# Patient Record
Sex: Female | Born: 1955 | Race: White | Marital: Married | Smoking: Never smoker
Health system: Northeastern US, Academic
[De-identification: ages and names within clinical notes are randomized; demographics above are authoritative.]

---

## 2010-06-15 ENCOUNTER — Ambulatory Visit: Payer: Self-pay

## 2010-06-15 DIAGNOSIS — H11002 Unspecified pterygium of left eye: Secondary | ICD-10-CM | POA: Insufficient documentation

## 2010-06-15 DIAGNOSIS — H04129 Dry eye syndrome of unspecified lacrimal gland: Secondary | ICD-10-CM | POA: Insufficient documentation

## 2010-06-15 DIAGNOSIS — H5210 Myopia, unspecified eye: Secondary | ICD-10-CM

## 2010-06-15 NOTE — Progress Notes (Signed)
 Subjective:   Subjective  Melissa Tanner 06/15/2010   30-Mar-1955 55 y.o. female Referring Dr./Source website  Chief Complaint   Patient presents with   . Blurred Vision     New Patient Eval     HPI     Patient presents for consideration of laser vision correction.  Would   like less dependency on glasses.  Rare SCL wearer.  Seminar attendee.        History reviewed. No pertinent past medical history.   History reviewed. No pertinent past surgical history.   History   Smoking status   . Never Smoker    Smokeless tobacco   . Not on file      History   Alcohol Use: Not on file      History   Drug Use Not on file      Current Outpatient Rx   Name Route Sig Dispense Refill   . YAZ PO Oral Take by mouth daily            Erythromycin  Specialty Problems     None           ROS     Negative for: Constitutional, Gastrointestinal, Neurological, Skin,   Genitourinary, Musculoskeletal, HENT, Endocrine, Cardiovascular, Eyes,   Respiratory, Psychiatric, Heme/Lymph    Other comments for: Allergic/Imm (Seasonal allergies)       Objective:   ObjectiveThere were no vitals filed for this visit.      Base Ophthalmology Exam        Visual Acuity    Right Left Both   Dist sc 20/200 20/400    Dist cc 20/20 20/20    Method: Snellen - Linear    Comments: Dominant OD   Tonometry    Right Left   Pressure 12 12   Method: Applanation   Time: 2:10 PM   Pachymetry    Right Left   Thickness 544 540   Date: 06/15/2010    Comments: ORBSCAN PACH  OD 549  OS 533   Wearing Rx    Sphere Cylinder Axis Add   Right -3.75 -0.75 141 +1.75   Left -4.75 -0.75 163 +1.75   Age: 6m   Type: Progr   Manifest Refraction    Sphere Cylinder Axis Dist   Right -3.50 -0.75 139 20/20   Left -4.50 -0.75 169 20/20   Manifest Refraction #2    Sphere Cylinder Axis Dist   Right -3.14 -0.72 137    Left -4.56 -0.84 169     Comments: Dominant OD    Mr#2 = Zywave PPR   Dilation   Both eyes: 1.0% Tropicamide @ 2:09 PM   Pupils    Pupils Dark Light   Right PERRLA 6.0 4.5   Left PERRLA  6.0 4.5   Visual Fields    Left Right   Result Full Full   Extraocular Movement    Right Left   Result Full Full      Base Ophthalmology Exam Addl. Tests        Keratometry    K1 Axis K2 Axis   Right 45.6 070 44.8 160   Left 45.2 078 44.1 168   Method: Orb Sim K    Comments: White to White   OD 12.0  OS 12.0    Posterior float   OD 0.043    OS 0.064 - revd by RV increased post. Float OS indicative of FFKC.       Additional Notes  Main Ophthalmology Exam        External Exam    Right Left   External Normal Normal   Slit Lamp Exam    Right Left   Lids/Lashes Normal Normal   Conjunctiva/Sclera White and quiet pt, Pinguecula   Cornea haze along nasal side pterygium nasal c slight haze, Haze   Anterior Chamber Deep and quiet Deep and quiet   Iris Round and reactive Round and reactive   Lens Clear Clear   Vitreous Normal Normal   Fundus Exam    Right Left   Disc Normal Normal   Macula Normal Normal   Vessels Normal Normal   Periphery Normal Normal       Neuro/Psych   Oriented x3: Yes   Mood/Affect: Normal                  No annotated images are attached to the encounter.         Assessment:   1. Myopia OU  2. 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 Pterygium OS  4. DES     Plan:   1./2. Pt educated about today's findings and that due to the corneal curvature, refractive surgery is contra-indicated. Also informed pt that likelihood of any progression is extremely small and will likely be stable for the rest of life. Pt should continue regular eye care.   3. Educated pt about today's finding. And should be monitored by regular eye exams and removed if progressive.  4. Pt asked what could be done to help her wear CL's longer. I suggested use restasis and have plugs LL OU. And try contact lenses approved for dry eye such as clarity h2O

## 2016-02-25 ENCOUNTER — Encounter: Payer: Self-pay | Admitting: Gastroenterology

## 2016-05-06 ENCOUNTER — Encounter: Payer: Self-pay | Admitting: Neurosurgery

## 2016-05-06 ENCOUNTER — Ambulatory Visit: Payer: PRIVATE HEALTH INSURANCE | Admitting: Neurosurgery

## 2016-05-06 DIAGNOSIS — M48062 Spinal stenosis, lumbar region with neurogenic claudication: Secondary | ICD-10-CM

## 2016-05-06 DIAGNOSIS — M4310 Spondylolisthesis, site unspecified: Secondary | ICD-10-CM

## 2016-05-06 HISTORY — DX: Spinal stenosis, lumbar region with neurogenic claudication: M48.062

## 2016-05-06 HISTORY — DX: Spondylolisthesis, site unspecified: M43.10

## 2016-05-06 NOTE — Progress Notes (Signed)
Melissa Points, DO  NEUROSURGERY     Brain, Complex spine and Minimally Invasive Surgery.    [facility].    [address] [place], [place] [address]  Ph: [phone]  Fax: [phone]             05/06/16      RE:  Melissa Tanner, 21-Mar-1955    Dear Dr. Mikey Bussing,    I had the pleasure of seeing Melissa Tanner today 05/06/2016  In consultation at my [place], [place] office with regards to low back and leg pain.    Melissa Tanner is a 61 y.o. right-handed female with low back and bilateral leg pain. The pain started in 2015 without any apparent trauma. The pain travels down both legs, right >> left. She also complains of burning pain, numbness/tingling. The pain has significantly worsened since Jan 2018. She states the pain is constant, rates 8/10. Pain is worse with movement, bending, twisting, and improves with rest.     She has done physical therapy did help temporarily.  She also had ESI- Dr Olena Leatherwood , initially the injections were helping but the most recent ( sept  18) did not help.      No bowel or bladder problems.    Past Medical History:   No past medical history on file.  Past Surgical History:   No past surgical history on file.  Allergies:   Allergies   Allergen Reactions    Erythromycin Rash       Medications:  Current Outpatient Prescriptions   Medication    celecoxib (CELEBREX) 200 MG capsule    DULoxetine (CYMBALTA) 60 MG capsule    traZODone (DESYREL) 50 MG tablet    diphenhydrAMINE (BENADRYL) 50 MG capsule     No current facility-administered medications for this visit.      Social History:  Social History     Social History    Marital status: Married     Spouse name: N/A    Number of children: N/A    Years of education: N/A     Social History Main Topics    Smoking status: Never Smoker    Smokeless tobacco: Never Used    Alcohol use Yes    Drug use: No    Sexual activity: Not Asked     Other Topics Concern    None     Social History Narrative          Family History:  No family history on file.    Review of Symptoms:  Twelve systems have been reviewed and pertinent positives noted in HPI.  All other systems negative.    BP 134/82   Pulse 79   Ht 1.626 m ( )   Wt 63.5 kg (140 lb)   BMI 24.03 kg/m2   Pain    05/06/16 1414   PainSc:   8   PainLoc: Back     Physical Exam:  Well-developed. No acute distress.  Eyes:  EOMI, sclera clear  Neck: Supple.  Non-tender. no pain with motion    Cardiovascular:  No cyanosis, good capillary refill.  Pulmonary:  Clear, even, and easy respirations.    Skin:  Warm, dry, and pink.  Musculoskeletal:   Normal tone.  No atrophy.  No fasciculations.       Neurological exam:      Alert and oriented to person, place and time.  Speech is clear and fluent.    Cranial nerves II-XII intact.  Strength:     Upper Extremities Deltoid Biceps Triceps WE WF Int   Right Left Lower Extremities Hip Flexion Knee Flexion Knee Extension DF PF EHL   Right Left Sensory exam normal;    Gait is narrow-based and steady.      DTR 2+ biceps, 2+ triceps, 2+ brachioradialis,     2+ patellar, 2+ Achilles'.      No clonus.  Hoffman's negative.      Toes down.  Romberg negative.  No dysmetria.      + SLR.      Imaging:     I personally reviewed the MRI of lumbar spine done on 09-10-2015 that shows grade 1 L4-5 spondylolisthesis with severe stenosis     Impression/Plan: 61 y.o. female with L4-5 grade 1 spondylolisthesis with severe stenosis    I reviewed the MRI with Melissa Tanner and discussed treatment options.    She has failed conservative treatment- I recommend surgery- L4-5 TLIF.  I discussed surgery and provided surgery information.  We will schedule surgery in the near future if she decides to proceed with surgery.    Should you have any questions regarding my plan, please do not hesitate to contact me personally.    Thank you for this opportunity to care for your  patient.    Sincerely,    Melissa Climes, DO  05/06/16  3:08 PM

## 2016-05-10 ENCOUNTER — Encounter: Payer: Self-pay | Admitting: Neurosurgery

## 2016-07-25 ENCOUNTER — Telehealth: Payer: Self-pay

## 2016-07-25 NOTE — Telephone Encounter (Signed)
Received a fax from [facility]  Surgery was approved reference # [id number]   [employer]Latina Vitale LPN

## 2016-08-22 ENCOUNTER — Other Ambulatory Visit: Payer: Self-pay

## 2016-08-22 DIAGNOSIS — M533 Sacrococcygeal disorders, not elsewhere classified: Secondary | ICD-10-CM

## 2016-08-22 DIAGNOSIS — W19XXXA Unspecified fall, initial encounter: Secondary | ICD-10-CM

## 2016-08-22 DIAGNOSIS — M549 Dorsalgia, unspecified: Secondary | ICD-10-CM

## 2016-08-24 ENCOUNTER — Encounter: Payer: Self-pay | Admitting: Gastroenterology

## 2016-08-26 ENCOUNTER — Encounter: Payer: Self-pay | Admitting: Gastroenterology

## 2016-09-07 ENCOUNTER — Encounter: Payer: Self-pay | Admitting: Gastroenterology

## 2016-09-07 ENCOUNTER — Ambulatory Visit: Payer: PRIVATE HEALTH INSURANCE | Admitting: Neurosurgery

## 2016-09-07 ENCOUNTER — Encounter: Payer: Self-pay | Admitting: Neurosurgery

## 2016-09-07 VITALS — BP 115/74 | HR 77 | Ht 65.0 in | Wt 138.0 lb

## 2016-09-07 DIAGNOSIS — M4310 Spondylolisthesis, site unspecified: Secondary | ICD-10-CM

## 2016-09-07 DIAGNOSIS — M48062 Spinal stenosis, lumbar region with neurogenic claudication: Secondary | ICD-10-CM

## 2016-09-07 MED ORDER — METHOCARBAMOL 500 MG PO TABS *I*
500.0000 mg | ORAL_TABLET | Freq: Three times a day (TID) | ORAL | 1 refills | Status: DC | PRN
Start: 2016-09-07 — End: 2016-12-21

## 2016-09-07 MED ORDER — HYDROCODONE-ACETAMINOPHEN 5-325 MG PO TABS *I*
1.0000 | ORAL_TABLET | Freq: Four times a day (QID) | ORAL | 0 refills | Status: DC | PRN
Start: 2016-09-07 — End: 2016-09-23

## 2016-09-07 NOTE — Progress Notes (Signed)
Dayle PointsSHAHNAWAZ H. Franklin Clapsaddle, DO  NEUROSURGERY     Brain, Complex spine and Minimally Invasive Surgery.    [facility].    [address] [place], [place] [id number]  Ph: [phone]  Fax: [phone]             09/07/16      RE:  Melissa MawKathleen Tanner, [birth year]    Dear Dr. Olena Tanner,    I had the pleasure of seeing Melissa Tanner today 09/07/2016  [place] with regards to low back and leg pain.    Melissa Tanner returns for pre-op today.  She fell few weeks ago and had xrays - no fracture.  She states now the left leg pain is worse than right leg     Prior notes:    Melissa Tanner is a 61 y.o. right-handed female with low back and bilateral leg pain. The pain started in 2015 without any apparent trauma. The pain travels down both legs, right >> left. She also complains of burning pain, numbness/tingling. The pain has significantly worsened since Jan 2018. She states the pain is constant, rates 8/10. Pain is worse with movement, bending, twisting, and improves with rest.     She has done physical therapy did help temporarily.  She also had ESI- Dr Melissa Tanner , initially the injections were helping but the most recent ( sept  18) did not help.      No bowel or bladder problems.    Past Medical History:   Past Medical History:   Diagnosis Date    Spinal stenosis of lumbar region with neurogenic claudication 05/06/2016    Spondylolisthesis, grade 1 05/06/2016     Past Surgical History:   No past surgical history on file.  Allergies:   Allergies   Allergen Reactions    Erythromycin Rash       Medications:  Current Outpatient Prescriptions   Medication    celecoxib (CELEBREX) 200 MG capsule    DULoxetine (CYMBALTA) 60 MG capsule    traZODone (DESYREL) 50 MG tablet     No current facility-administered medications for this visit.      Social History:  Social History     Social History    Marital status: Married     Spouse name: N/A    Number of children: N/A     Years of education: N/A     Social History Main Topics    Smoking status: Never Smoker    Smokeless tobacco: Never Used    Alcohol use Yes    Drug use: No    Sexual activity: Not Asked     Other Topics Concern    None     Social History Narrative     Family History:  No family history on file.    Review of Symptoms:  Twelve systems have been reviewed and pertinent positives noted in HPI.  All other systems negative.    BP 115/74   Pulse 77   Ht 1.651 m (5\' 5" )   Wt 62.6 kg (138 lb)   BMI 22.96 kg/m2   Pain    09/07/16 1056   PainSc:   8   PainLoc: Back     Physical Exam:  Well-developed. No acute distress.  Eyes:  EOMI, sclera clear  Neck: Supple.  Non-tender. no pain with motion    Cardiovascular:  No cyanosis, good capillary refill.  Pulmonary:  Clear, even, and easy respirations.    Skin:  Warm, dry, and pink.  Musculoskeletal:   Normal tone.  No atrophy.  No fasciculations.       Neurological exam:      Alert and oriented to person, place and time.  Speech is clear and fluent.    Cranial nerves II-XII intact.      Strength:     Upper Extremities Deltoid Biceps Triceps WE WF Int   Right 5 5 5 5 5 5    Left 5 5 5 5 5 5      Lower Extremities Hip Flexion Knee Flexion Knee Extension DF PF EHL   Right 5 5 5 5 5 5    Left 5 5 5 5 5 5        Sensory exam normal;    Gait is narrow-based and steady.      DTR 2+ biceps, 2+ triceps, 2+ brachioradialis,     2+ patellar, 2+ Achilles'.      No clonus.  Hoffman's negative.      Toes down.  Romberg negative.  No dysmetria.      + SLR.      Imaging:     I personally reviewed the MRI of lumbar spine done on 09-10-2015 that shows grade 1 L4-5 spondylolisthesis with severe stenosis     Impression/Plan: 61 y.o. female with L4-5 grade 1 spondylolisthesis with severe stenosis    I reviewed the MRI with Melissa Tanner and discussed treatment options.    She has failed conservative treatment- I recommend surgery- L4-5 TLIF.  I discussed surgery and provided surgery  information.      I discussed the surgery in detail. I discussed risks/benefits of surgery including but not limited to infection, bleeding, CSF leak, nerve injury, numbness/weakness/paralysis, adjacent level stenosis, sub optimal placement of hardware, use of allograft/autograft, pseudoarthrosis etc all questions were answered and she gave informed consent and requests to proceed with surgery.     Should you have any questions regarding my plan, please do not hesitate to contact me personally.    Thank you for this opportunity to care for your patient.    Sincerely,    Phillips Climes, DO  09/07/16  11:11 AM

## 2016-09-14 ENCOUNTER — Encounter: Payer: Self-pay | Admitting: Gastroenterology

## 2016-09-14 DIAGNOSIS — M48062 Spinal stenosis, lumbar region with neurogenic claudication: Secondary | ICD-10-CM

## 2016-09-14 DIAGNOSIS — M4310 Spondylolisthesis, site unspecified: Secondary | ICD-10-CM

## 2016-09-14 HISTORY — PX: LUMBAR FUSION: SHX111

## 2016-09-21 ENCOUNTER — Encounter: Payer: Self-pay | Admitting: Neurosurgery

## 2016-09-21 ENCOUNTER — Ambulatory Visit: Payer: PRIVATE HEALTH INSURANCE | Attending: Neurosurgery | Admitting: Neurosurgery

## 2016-09-21 VITALS — BP 118/71 | HR 85 | Ht 65.0 in | Wt 138.0 lb

## 2016-09-21 DIAGNOSIS — M4310 Spondylolisthesis, site unspecified: Secondary | ICD-10-CM

## 2016-09-21 DIAGNOSIS — M48062 Spinal stenosis, lumbar region with neurogenic claudication: Secondary | ICD-10-CM

## 2016-09-21 DIAGNOSIS — Z981 Arthrodesis status: Secondary | ICD-10-CM

## 2016-09-21 HISTORY — DX: Arthrodesis status: Z98.1

## 2016-09-21 NOTE — Progress Notes (Signed)
Dayle PointsSHAHNAWAZ H. Herberth Deharo, DO  NEUROSURGERY     Brain, Complex spine and Minimally Invasive Surgery.    [facility].    [address] [place], [place] [id number]  Ph: [phone]  Fax: [phone]                     September 21, 2016    RE:  Melissa MawKathleen Lopezperez, 14-Feb-1955      Primary Provider: Fears, Claudette Headebecca Olexa, NP      Dear Dr. Carolyn StareFears, Claudette Headebecca Olexa, NP    I had the pleasure of seeing Melissa Tanner today 09/21/2016 in follow up .    As you know, Melissa Tanner is a 61 y.o. right-handed female s/p L4-5 fusion done on 09-14-16.  She returns for follow up.  She is doing well, just incisional pain , no leg pain  Wound is healing well        Impression/Plan: Melissa Tanner is a 61 y.o. female s/p L4-5 fusion    Doing well. Recovering as expected.  F/u 3 months, start PT in 3 weeks.    Should you have any questions regarding my plan, please do not hesitate to contact me personally.    Thank you for this opportunity to care for your patient.    Sincerely,        Phillips ClimesShahnawaz Zaryah Seckel, DO  09/21/16  11:28 AM

## 2016-09-23 ENCOUNTER — Other Ambulatory Visit: Payer: Self-pay

## 2016-09-29 MED ORDER — HYDROCODONE-ACETAMINOPHEN 5-325 MG PO TABS *I*
1.0000 | ORAL_TABLET | Freq: Four times a day (QID) | ORAL | 0 refills | Status: DC | PRN
Start: 2016-09-29 — End: 2016-12-21

## 2016-11-02 ENCOUNTER — Ambulatory Visit: Payer: PRIVATE HEALTH INSURANCE | Admitting: Neurosurgery

## 2016-12-08 ENCOUNTER — Encounter: Payer: Self-pay | Admitting: Gastroenterology

## 2016-12-21 ENCOUNTER — Ambulatory Visit: Payer: PRIVATE HEALTH INSURANCE | Attending: Neurosurgery | Admitting: Neurosurgery

## 2016-12-21 ENCOUNTER — Encounter: Payer: Self-pay | Admitting: Neurosurgery

## 2016-12-21 VITALS — BP 160/85 | HR 96 | Ht 65.0 in | Wt 138.0 lb

## 2016-12-21 DIAGNOSIS — Z981 Arthrodesis status: Secondary | ICD-10-CM

## 2016-12-21 DIAGNOSIS — M48062 Spinal stenosis, lumbar region with neurogenic claudication: Secondary | ICD-10-CM

## 2016-12-21 DIAGNOSIS — M4310 Spondylolisthesis, site unspecified: Secondary | ICD-10-CM

## 2016-12-21 NOTE — Addendum Note (Signed)
Addended by: Phillips ClimesQURESHI, Kelsea Mousel on: 12/21/2016 11:36 AM     Modules accepted: Orders

## 2016-12-21 NOTE — Progress Notes (Signed)
Dayle PointsSHAHNAWAZ H. Aracelie Addis, DO  NEUROSURGERY     Brain, Complex spine and Minimally Invasive Surgery.    [facility].    [address] [place], [place] [id number]  Ph: [phone]  Fax: [phone]                     December 21, 2016    RE:  Melissa Tanner, 03-02-1955      Primary Provider: Fears, Claudette Headebecca Olexa, NP      Dear Dr. Carolyn StareFears, Claudette Headebecca Olexa, NP    I had the pleasure of seeing Melissa Tanner today 12/21/2016 in follow up .    As you know, Melissa Tanner is a 61 y.o. right-handed female s/p L4-5 fusion done on 09-14-16.  She returns for follow up.  She is doing well, she has some back pain that last few minutes or so.   No leg pain.     Wound is healed.    She has completed PT.      Impression/Plan: Melissa Tanner is a 61 y.o. female s/p L4-5 fusion      I reviewed the Xrays done 10-20-10 that shows hardware in good position.  I reassured the patient.  She has some back pain that is more muscular in nature and I advised her to do conservative treatment- PT    Follow up with me one year with xray's of lumbar spine.    Should you have any questions regarding my plan, please do not hesitate to contact me personally.    Thank you for this opportunity to care for your patient.    Sincerely,        Phillips ClimesShahnawaz Leveta Wahab, DO  12/21/16  11:31 AM

## 2017-12-22 ENCOUNTER — Ambulatory Visit: Payer: PRIVATE HEALTH INSURANCE | Admitting: Neurosurgery

## 2019-03-28 IMAGING — CT CT LUMBAR SPINE WITHOUT CONTRAST
3 of 12 series · 11 of 33 positions shown, 13 images · non-contrast
Comparison: There are no previous exams available for comparison.

CT LUMBAR SPINE WITHOUT CONTRAST, 03/28/2019 [DATE]: 
CLINICAL INDICATION: Evaluate fusion healing, hardware loosening, stenosis, low 
back pain, right radiculopathy 
A search for DICOM formatted images was conducted for prior CT imaging studies 
completed at a non-affiliated media free facility.
TECHNIQUE: The lumbar spine was scanned from T12 through mid-sacrum without 
contrast on a high-resolution CT scanner using dose reduction techniques. 
RoutIne MPR reconstructions were performed.

[Series 3: axial · axial · 0.36mm/px · z∈[-294,-52]mm · 3 of 122 slices shown, 4 images]
[im 1/122  soft-tissue]
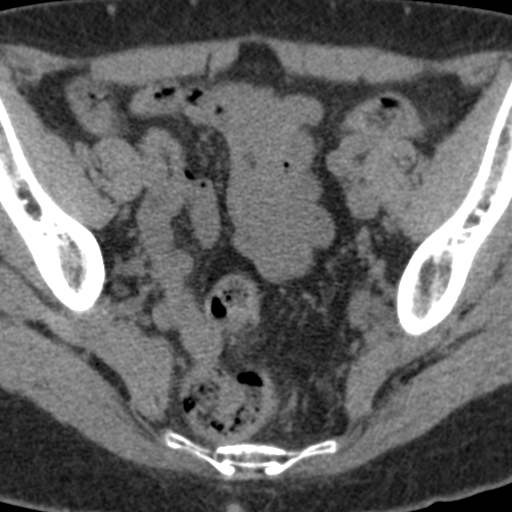
[im 1/122  bone]
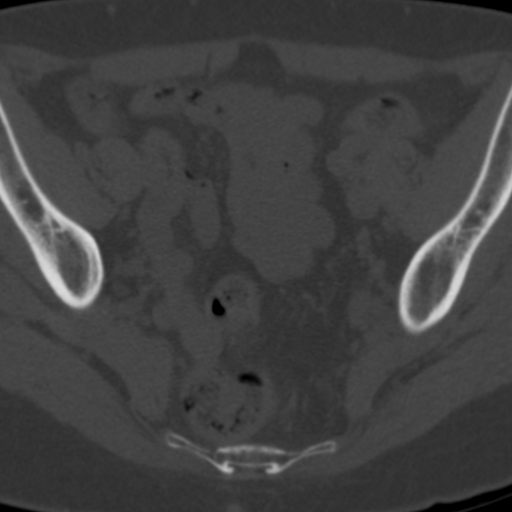
[im 61/122  bone]
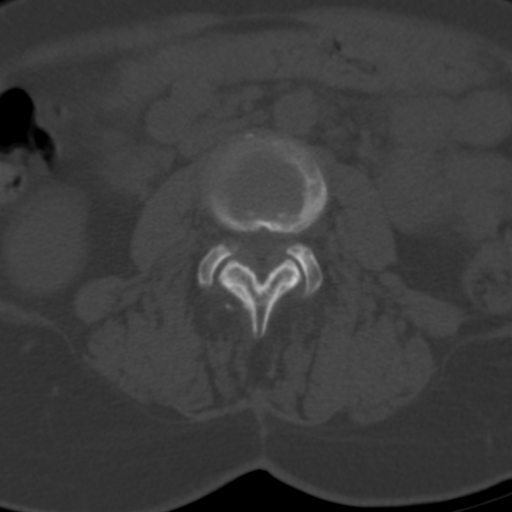
[im 122/122  bone]
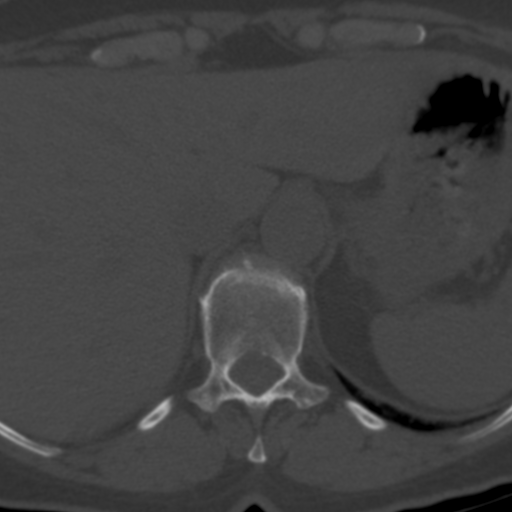

[Series 8: sag (person_name) · sagittal · 0.40mm/px · 5 of 75 slices shown, 6 images]
[im 25/75  bone]
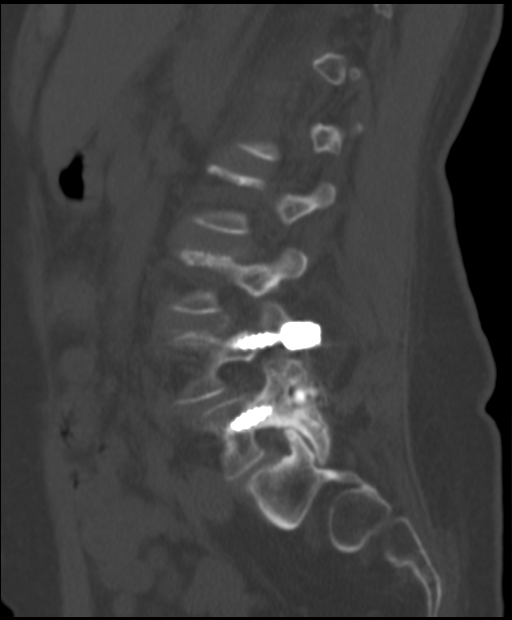
[im 31/75  bone]
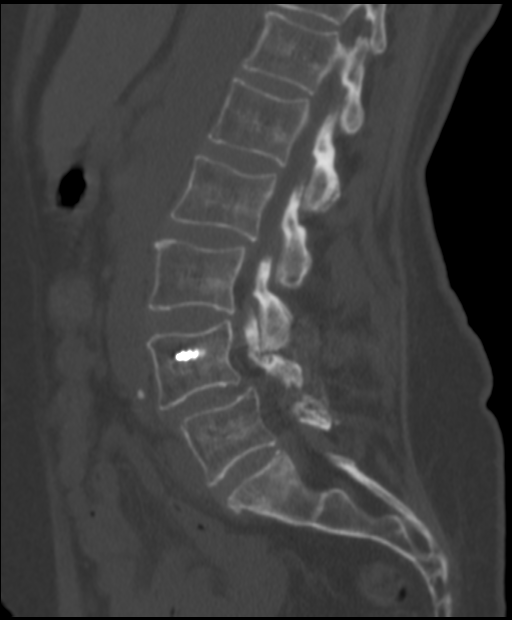
[im 38/75  soft-tissue]
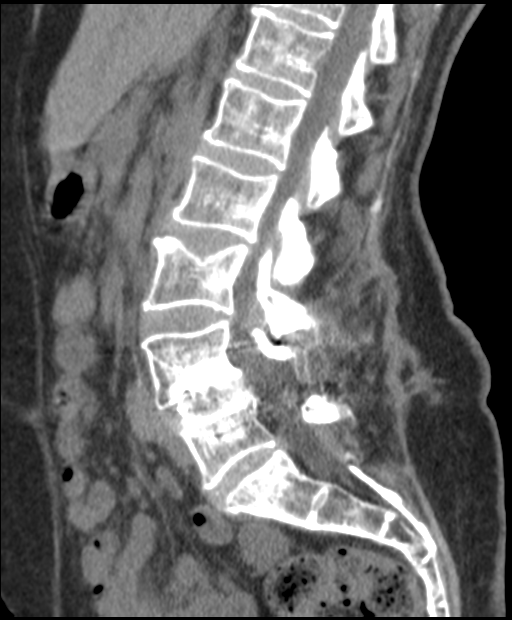
[im 38/75  bone]
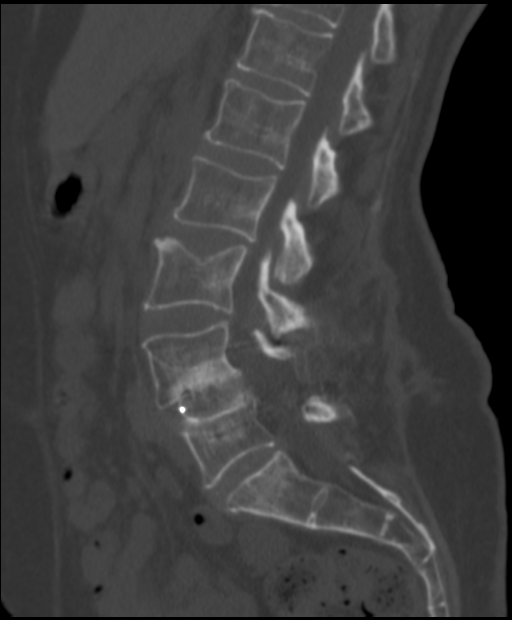
[im 44/75  bone]
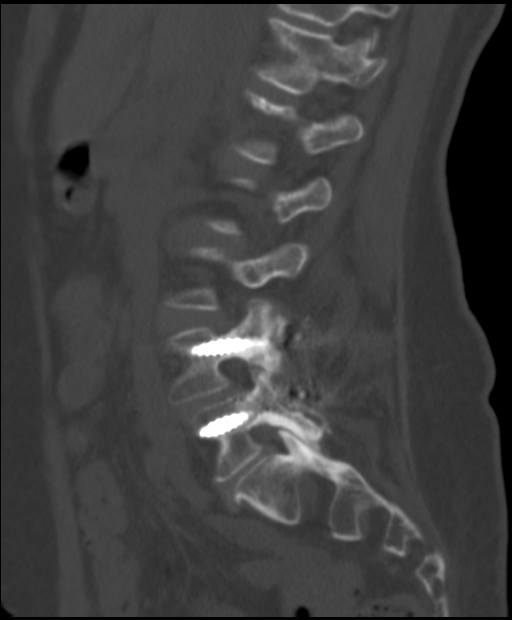
[im 50/75  bone]
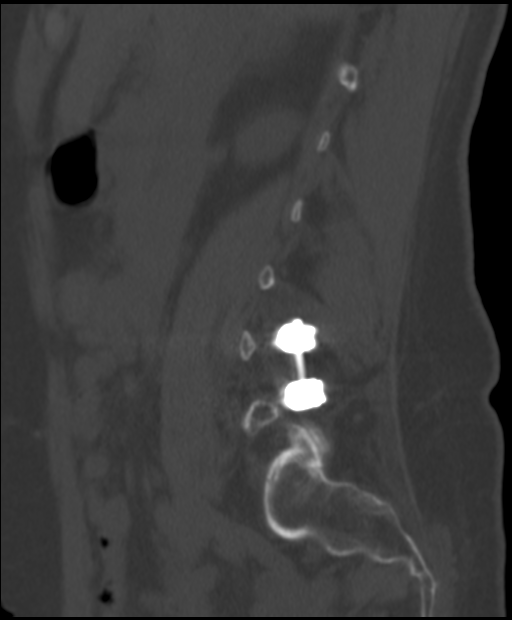

[Series 9: (person_name) · coronal · 0.42mm/px · 3 of 76 slices shown]
[im 16/76  bone]
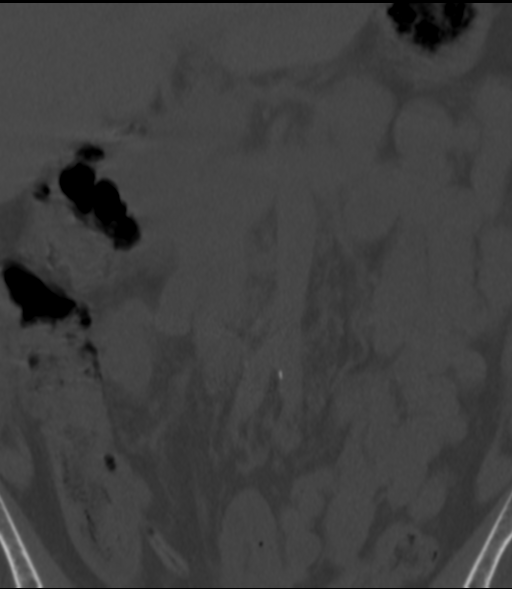
[im 31/76  bone]
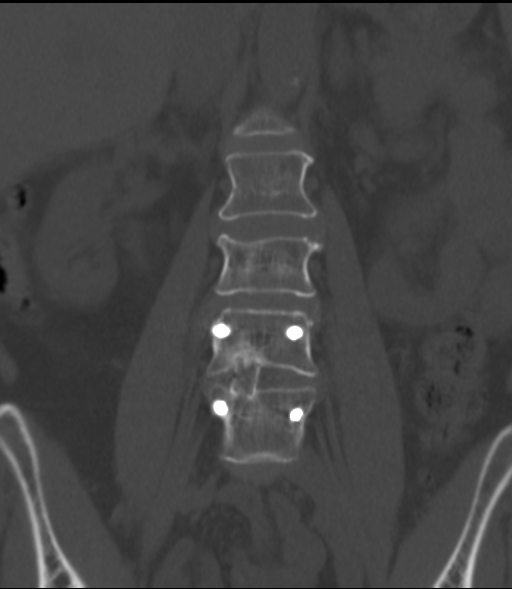
[im 46/76  bone]
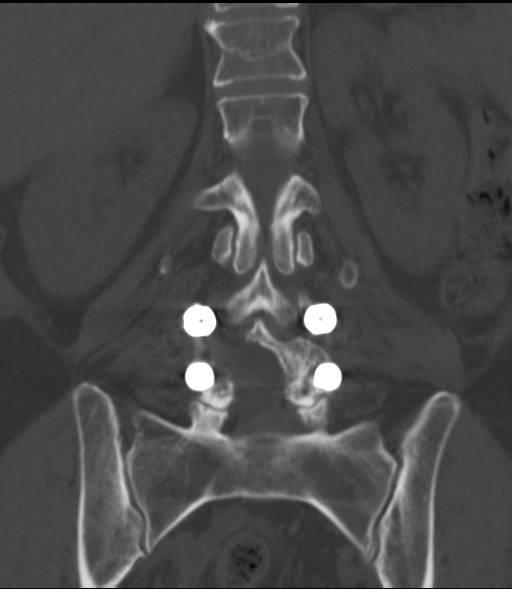

[11 of 33 positions shown; findings below may reference images not displayed]

FINDINGS: There is a chronic Schmorl's node along the upper L3 endplate. There 
is no compression fracture. There is less than grade 1 anterolisthesis of L4 on 
L5. There is an interbody disc cage at this level, and there are bilateral 
pedicle screws at L4 and L5 appearing well positioned, without evidence for 
loosening. 
At L5-S1 there is an approximately 13 x 7 mm left paracentral disc extrusion 
which deforms the thecal sac and impinges the left S1 nerve root. This is best 
seen on axial image 87. There is mild bilateral foraminal narrowing. 
At L4-5 there has been posterior decompression. The canal and foramina are open. 
At L3-4 there appears to be moderate canal stenosis due to broad-based disc 
bulge and moderate facet change with ligamentous thickening, best seen on axial 
image 63. There is no significant foraminal stenosis. 
At L2-3 there is 1-2 mm retrolisthesis, disc bulge and ligamentous thickening 
contributing to mild-to-moderate canal stenosis. Foramina are open. 
At L1-2 and T12-L1 the canal and foramina remain open.
IMPRESSION: At L5-S1 there appears to be a left paracentral disc extrusion measuring 
approximately 13 x 7 mm, deforming the left ventral thecal sac and appearing to 
impinge the exiting left S1 nerve root. 
Moderate canal stenosis at L3-4. Mild to moderate canal stenosis at L2-3 where 
there is slight retrolisthesis. 
Fusion hardware at L4-5 appears intact, in good position, without. 
Schmorl's node along the upper L3 endplate appears chronic. No compression 
fracture. No bone destruction. 
RADIATION DOSE REDUCTION: All CT scans are performed using radiation dose 
reduction techniques, when applicable.  Technical factors are evaluated and 
adjusted to ensure appropriate moderation of exposure.  Automated dose 
management technology is applied to adjust the radiation doses to minimize 
exposure while achieving diagnostic quality images.

## 2019-03-28 IMAGING — MR MRI LUMBAR SPINE WITHOUT CONTRAST
5 of 8 series · 18 of 48 positions shown · IV contrast (gadolinium)
Comparison: Today's lumbar CT

MRI LUMBAR SPINE WITHOUT CONTRAST, 03/28/2019 [DATE]: 
CLINICAL INDICATION: Low back pain, right radiculopathy, evaluate for stenosis, 
hardware loosening
TECHNIQUE: Sagittal T1, Sagittal T2, Sagittal STIR, Axial T1 and Axial T2 MR 
images of the lumbar spine were performed without intravenous gadolinium 
enhancement.

[Series 101: survey · axial · 10.0mm · 1.39mm/px · z∈[-14,+200]mm · 3 of 9 slices shown]
[im 1/9]
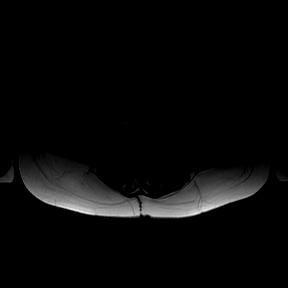
[im 5/9]
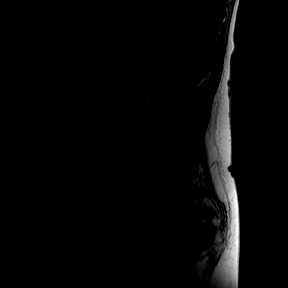
[im 9/9]
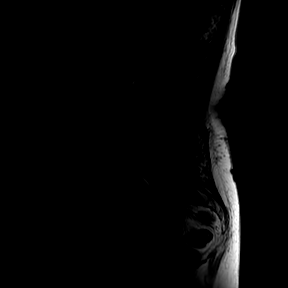

[Series 201: t2w_cor-surv · coronal · 6.0mm · 0.61mm/px · 1 of 5 slices shown]
[im 1/5]
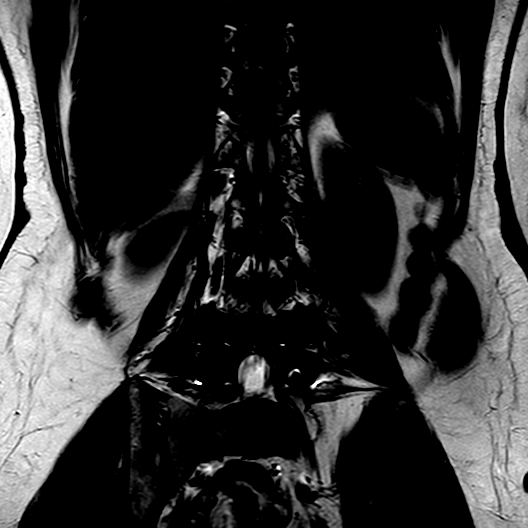

[Series 301: t1_sag_(person_name) · sagittal · 4.0mm · 0.34mm/px · 5 of 17 slices shown]
[im 1/17]
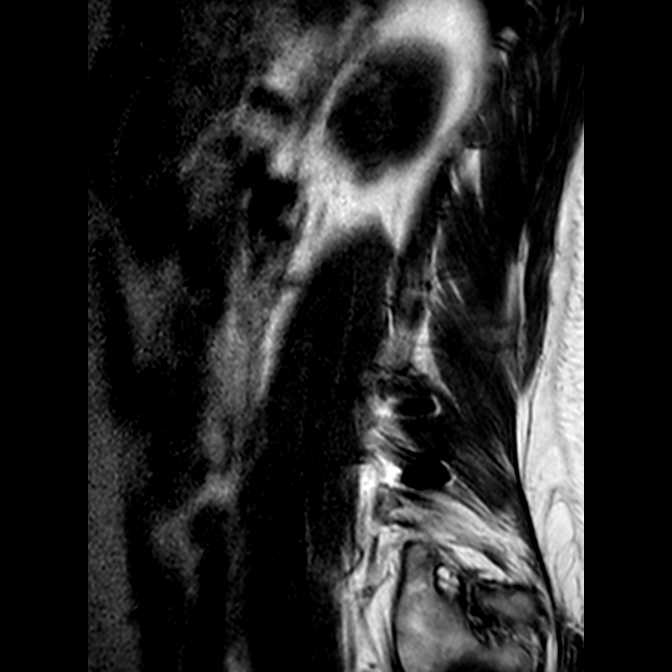
[im 5/17]
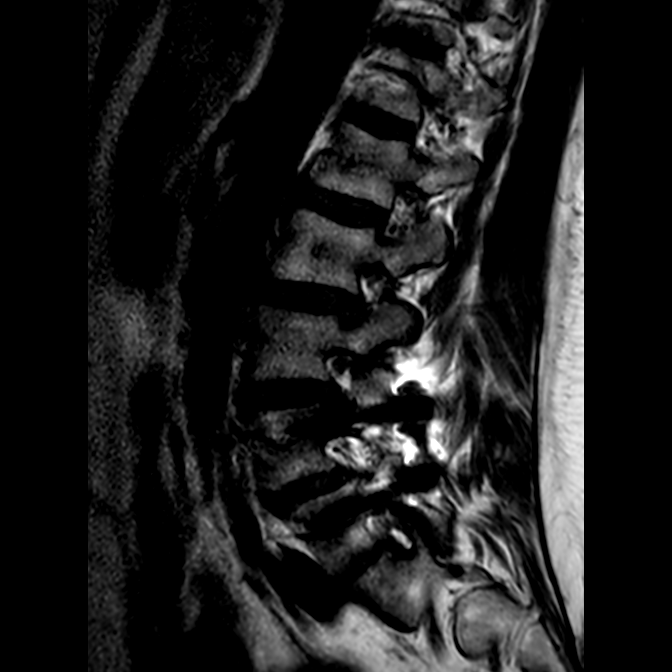
[im 9/17]
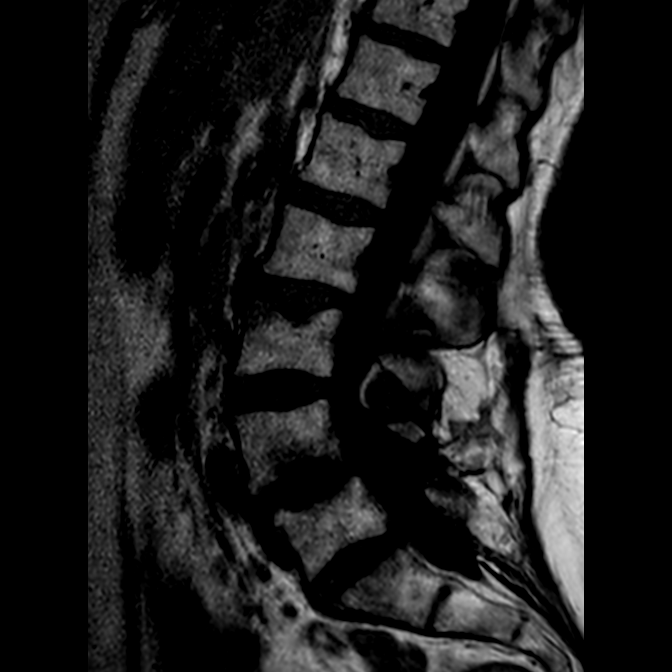
[im 13/17]
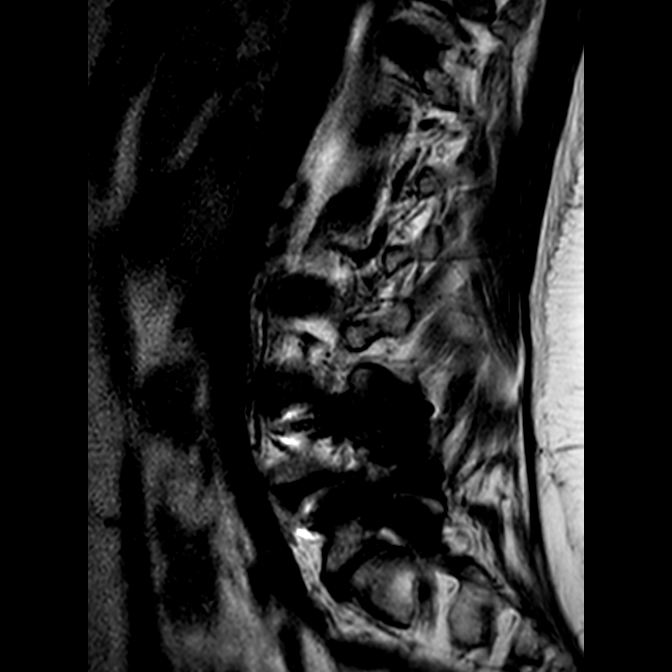
[im 17/17]
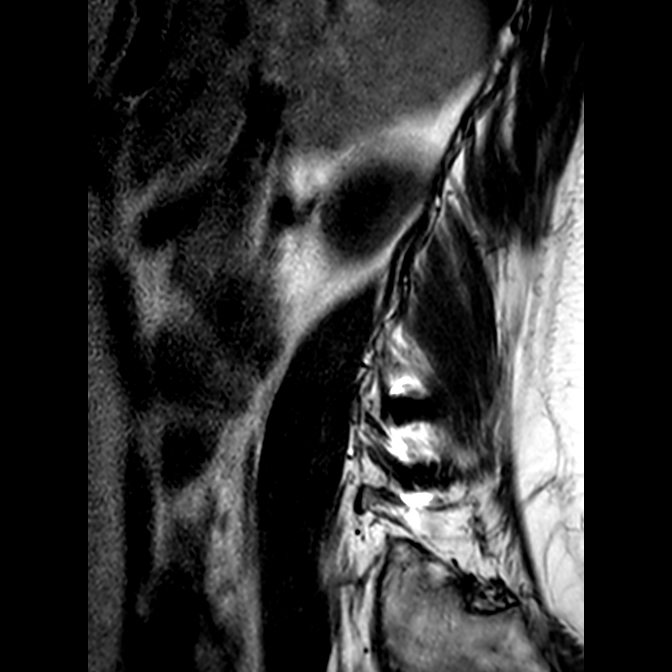

[Series 401: t2_(person_name)_(person_name) · sagittal · 4.0mm · 0.34mm/px · 1 of 17 slices shown]
[im 1/17]
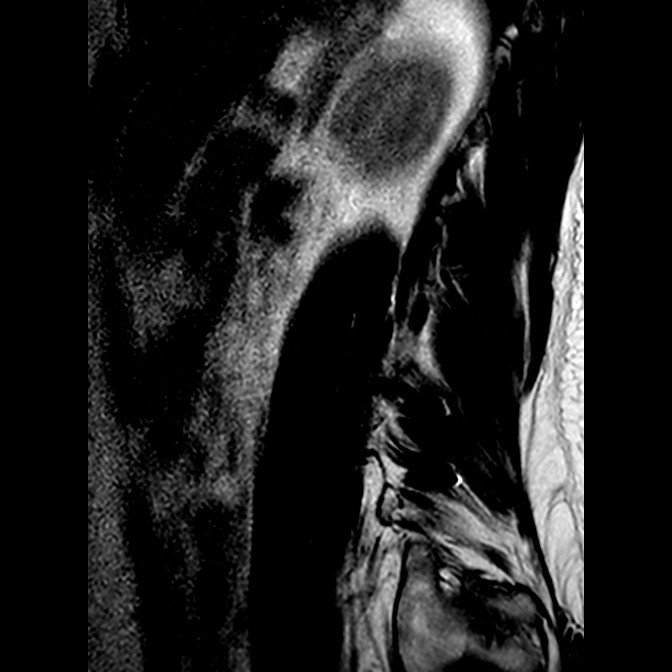

[Series 701: T1 · axial · 4.0mm · 0.38mm/px · z∈[-109,+97]mm · 8 of 35 slices shown]
[im 1/35]
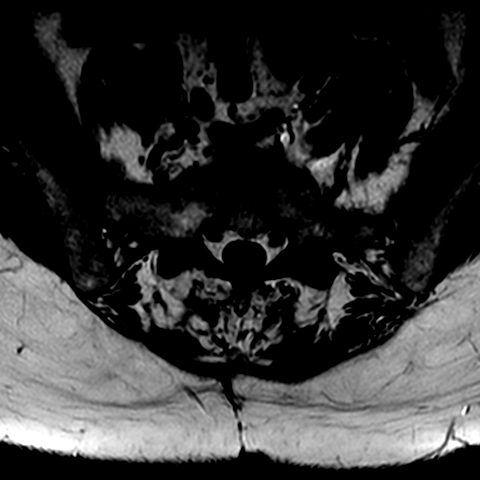
[im 4/35]
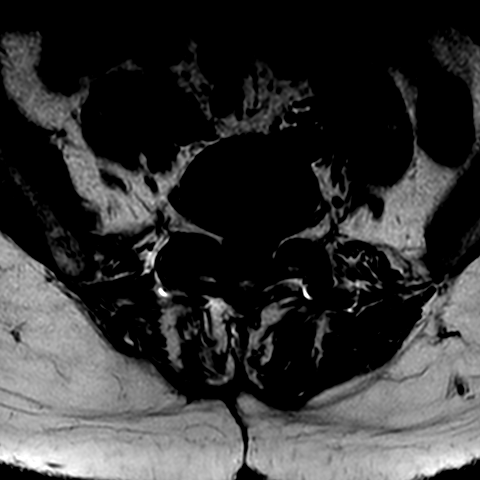
[im 12/35]
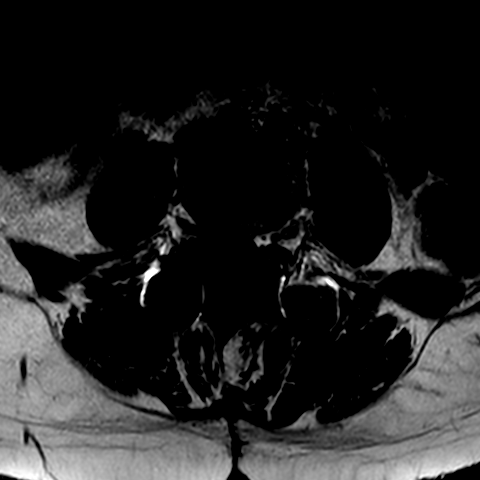
[im 16/35]
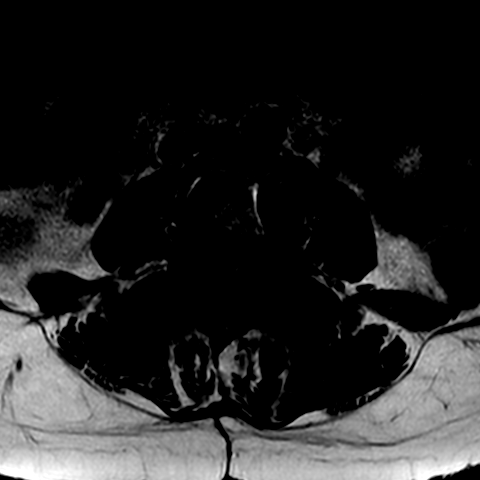
[im 19/35]
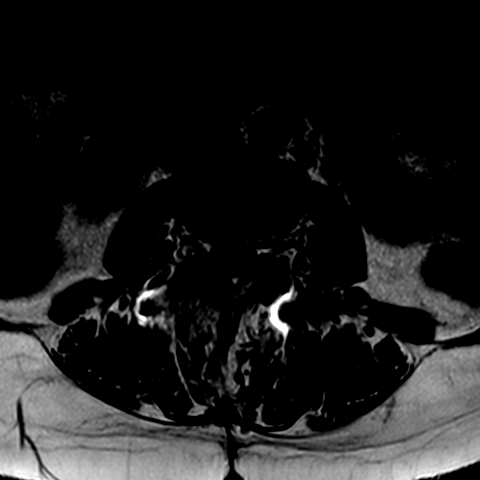
[im 23/35]
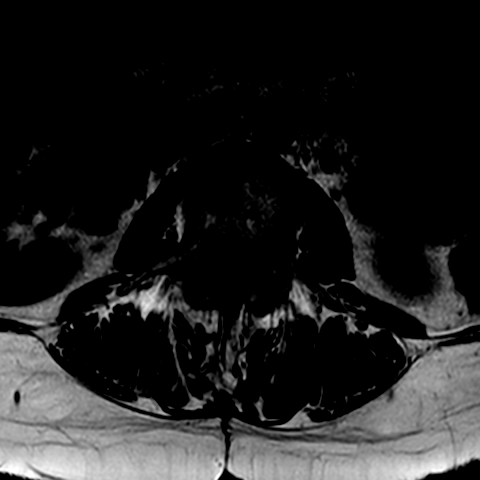
[im 31/35]
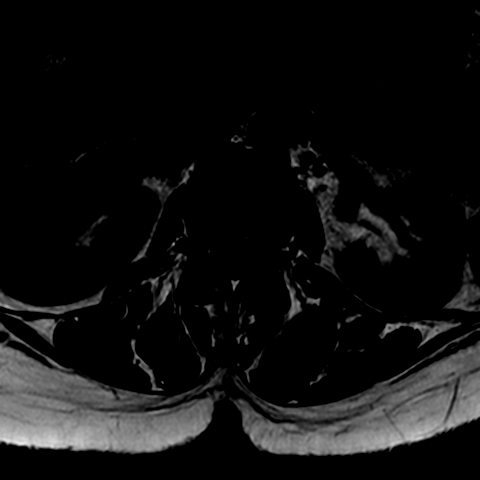
[im 35/35]
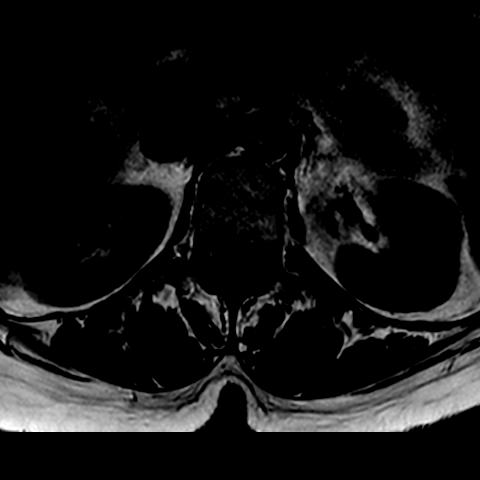

[18 of 48 positions shown; findings below may reference images not displayed]

FINDINGS: Lumbar vertebral heights are intact. There is a chronic Schmorl's node 
along the upper L3 endplate. L4-5 fusion hardware is well positioned, without 
evidence for loosening. 
There is a left paracentral disc extrusion at L5-S1 measuring 11 x 5 mm on axial 
T2 image 3. This deforms the left ventral thecal and exiting left S1 nerve root. 
There is mild bilateral foraminal stenosis. 
At L4-5 the canal is open. Mild right foraminal stenosis 
At L3-4 there is moderate canal stenosis. Foramina are open. 
At L2-3 there is broad-based disc bulge contributing to mild-to-moderate canal 
stenosis, with 2 mm retrolisthesis. Foramina are open. 
At L1-2 the canal and foramina are open.. This 
There is no evidence for fracture or spinal malignancy. The conus is normal. 
Visualized sacrum is intact.
IMPRESSION: Left paracentral disc extrusion impinges the left S1 nerve root. 
Moderate canal stenosis at L3-4 and slightly less pronounced mild to moderate 
stenosis at L2-3. There is 2 mm retrolisthesis of L2 on L3. 
The appearance of L4-5 fusion hardware. 
No evidence for recent fracture or malignancy. Chronic L3 Schmorl's node.

## 2021-09-23 IMAGING — MR MRI LUMBAR SPINE WITHOUT CONTRAST
7 of 9 series · 15 of 48 positions shown · IV contrast (gadolinium)
Comparison: Lumbar MRI March 28, 2019

________________________________________________________________________________________________ 
MRI LUMBAR SPINE WITHOUT CONTRAST, 09/23/2021 [DATE]: 
CLINICAL INDICATION: Spinal stenosis, low back pain worse on right
TECHNIQUE: Sagittal T1, Sagittal T2, Sagittal STIR, Axial T1 and Axial T2 MR 
images of the lumbar spine were performed without intravenous gadolinium 
enhancement.

[Series 101: survey · axial · 10.0mm · 1.25mm/px · z∈[-33,+201]mm · 2 of 10 slices shown]
[im 1/10]
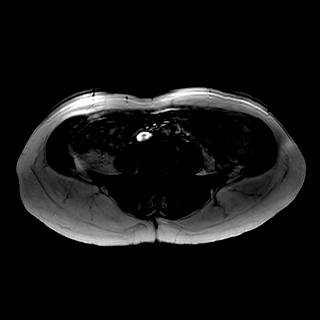
[im 10/10]
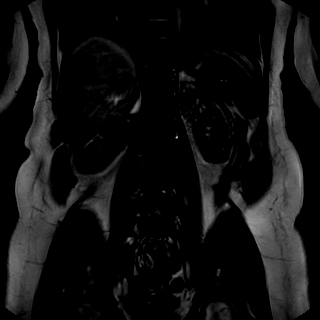

[Series 201: t2w_cor-surv · coronal · 6.0mm · 0.62mm/px · 1 of 10 slices shown]
[im 1/10]
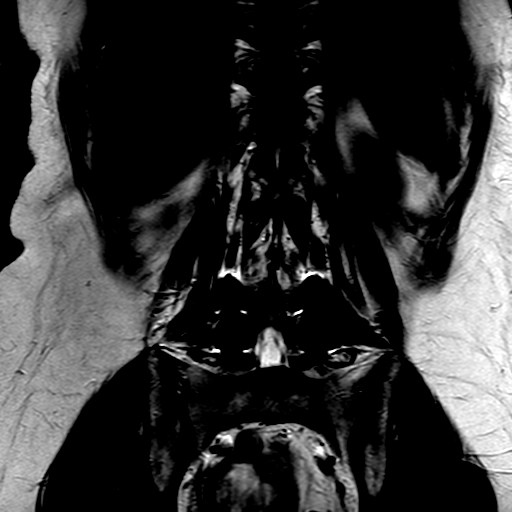

[Series 301: t1_tse_sag · sagittal · 4.0mm · 0.48mm/px · 2 of 18 slices shown]
[im 1/18]
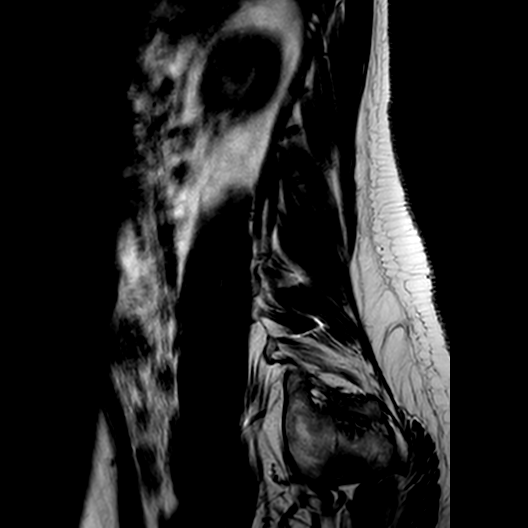
[im 18/18]
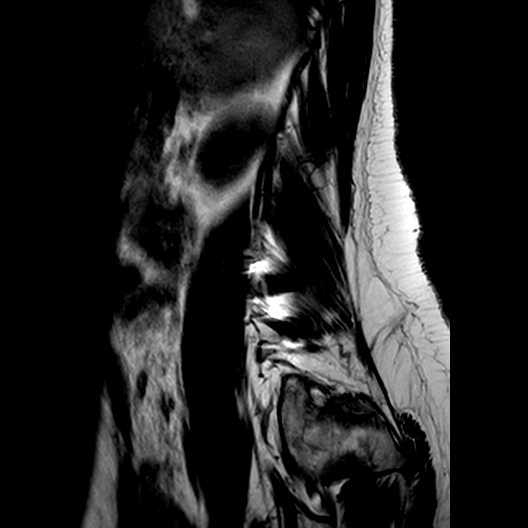

[Series 402: (id)_mdixon_tse · sagittal · 4.0mm · 0.40mm/px · 2 of 18 slices shown]
[im 1/18]
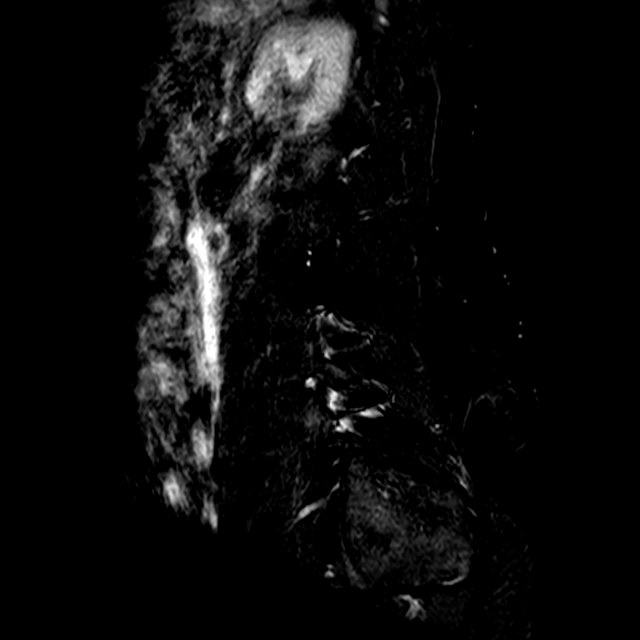
[im 18/18]
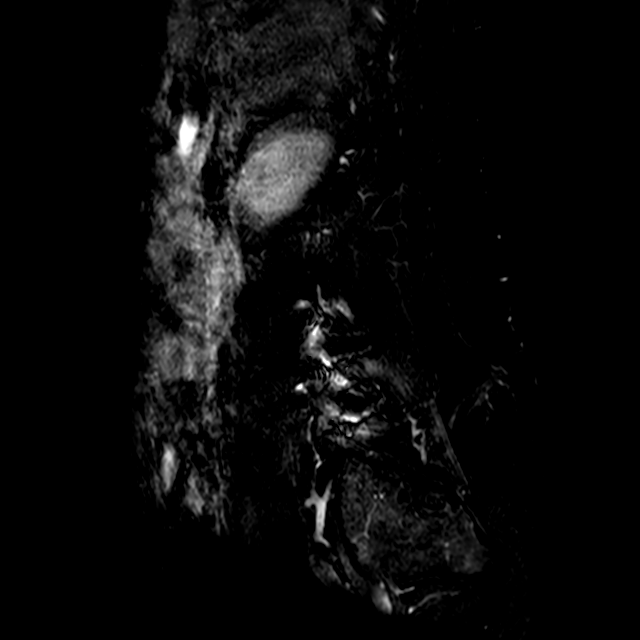

[Series 403: st2w_mdixon_tse · sagittal · 4.0mm · 0.40mm/px · 2 of 18 slices shown]
[im 1/18]
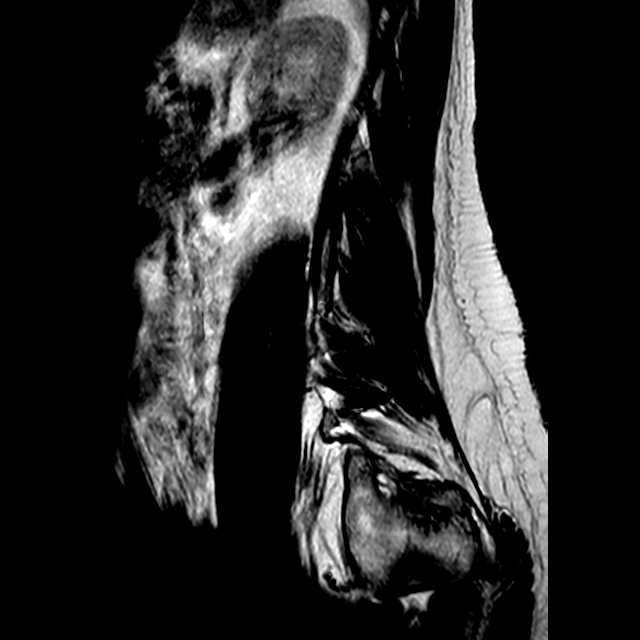
[im 18/18]
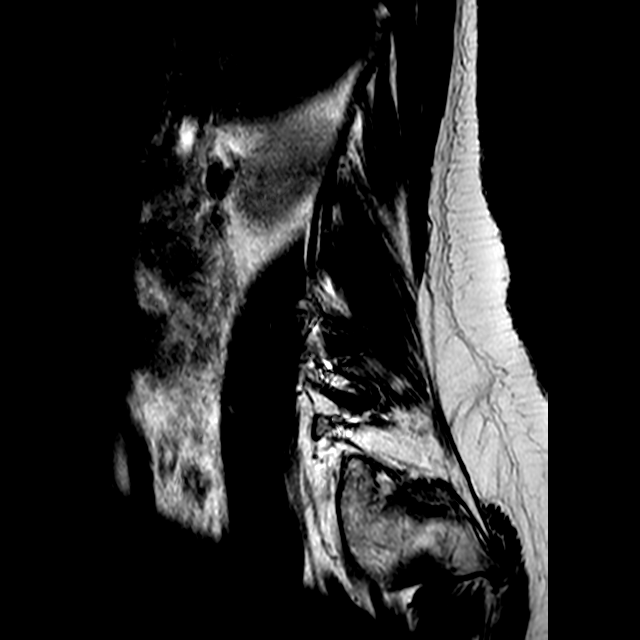

[Series 502: (id) view_ax mpr · axial · 1.0mm · 0.25mm/px · 1 of 117 slices shown]
[im 1/117]
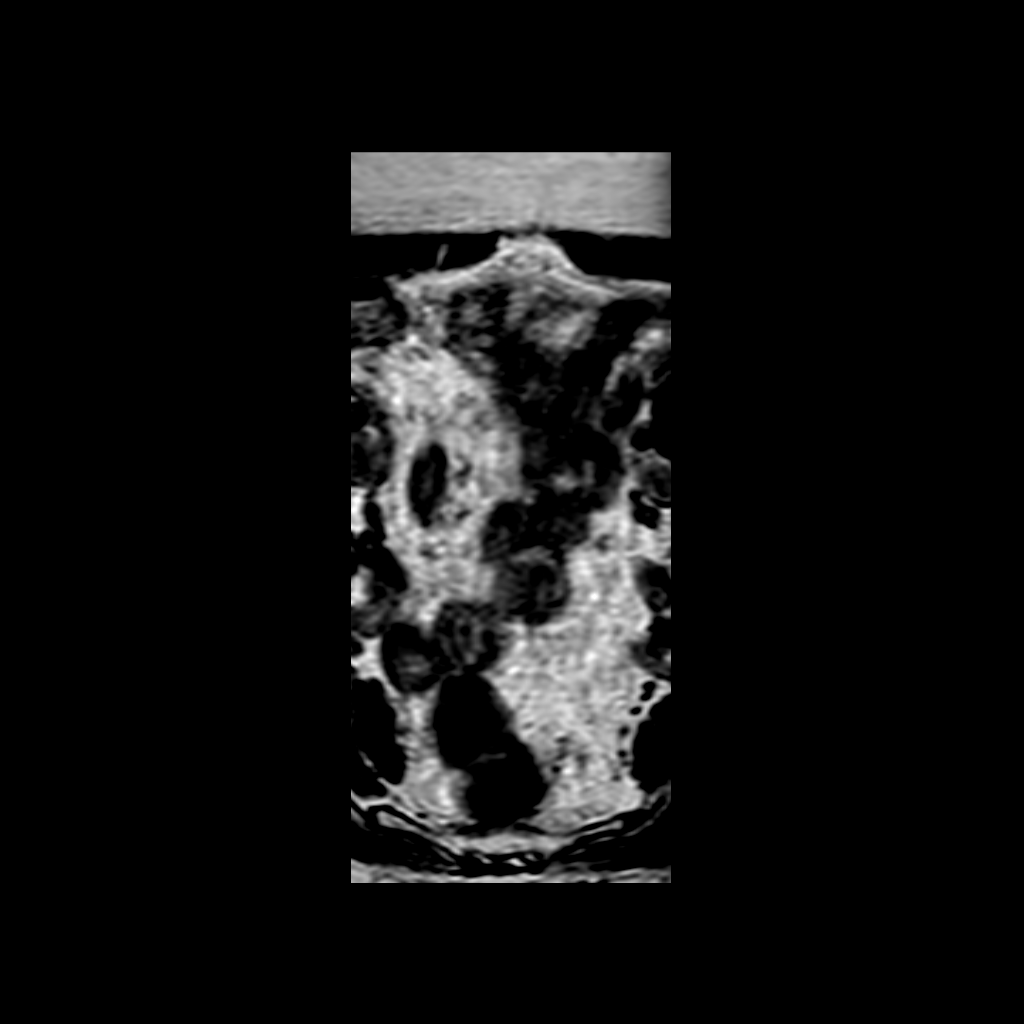

[Series 701: T1 · axial · 4.0mm · 0.33mm/px · z∈[-91,+83]mm · 5 of 40 slices shown]
[im 1/40]
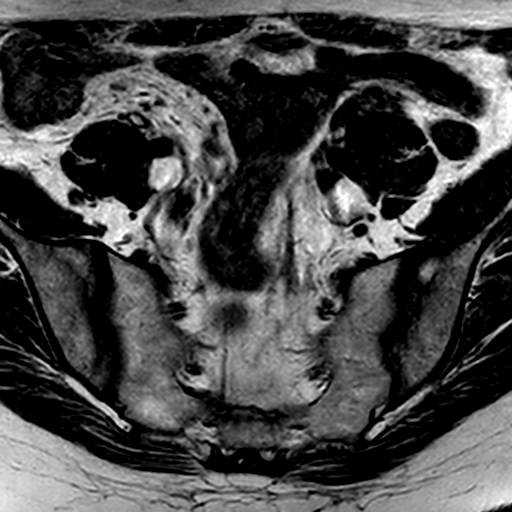
[im 10/40]
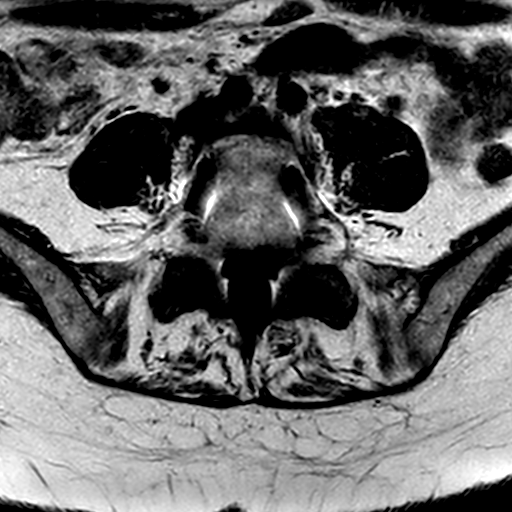
[im 20/40]
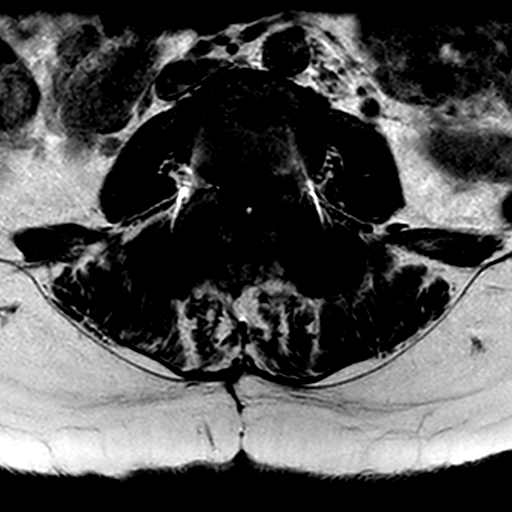
[im 30/40]
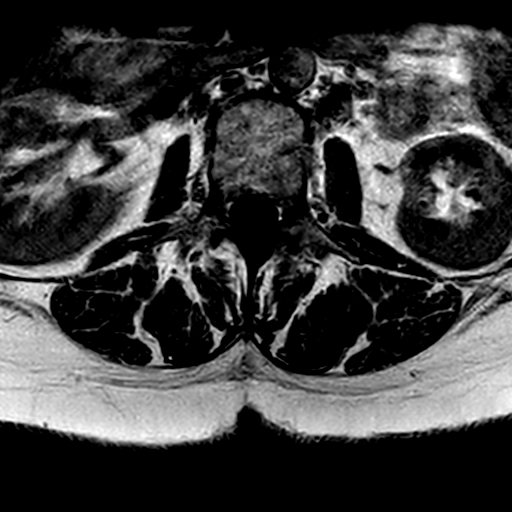
[im 40/40]
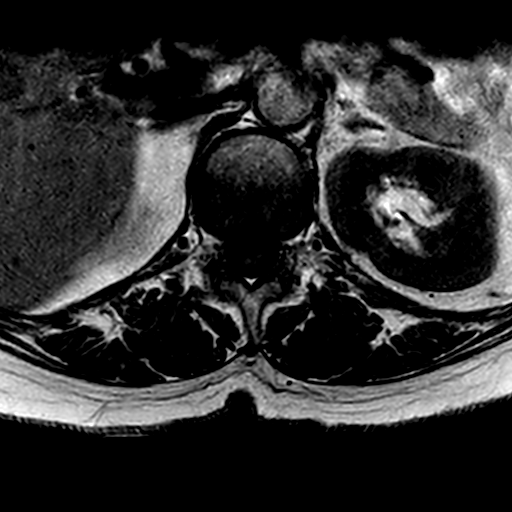

[15 of 48 positions shown; findings below may reference images not displayed]

FINDINGS: There is a chronic Schmorls node along the upper L3 endplate which is 
unchanged. Other lumbar vertebral heights are intact. Patient is status post 
L4-5 pedicle screw and interbody disc cage fusion. There is no evidence for 
recent fracture or malignancy. Conus terminates at T12-L1. Slight upper lumbar 
levocurvature. 
At L5-S1 there is a left paracentral disc extrusion with mild deformity on the 
left S1 nerve root as it exits the dural tube, axial T2 image 3. This is 
improved compared to the prior study where disc extrusion measuring 
approximately 11 x 5 mm, currently approximately 10 x 3 mm. Deformity on the 
thecal sac is reduced. Left foramen is open. Mild right foraminal narrowing is 
unchanged. 
At L4-5 there has been decompression. The canal is open. Foramina are open. 
At L3-4 there is mild canal stenosis, axial T2 image 13 due to facet and 
ligamentous hypertrophy and mild broad-based disc bulge, not significantly 
different. Foramina are open. 
At L2-3 there is slight retrolisthesis and mild disc bulge contributing to 
borderline-mild canal stenosis, not significantly different. Foramina are open. 
At L1-2 the canal and foramina are open.
IMPRESSION: Decreased size of left paracentral disc extrusion at L5-S1. Deformity on the 
left S1 nerve root is reduced compared to the March 2019 MRI. 
Stable appearance of L4-5 fusion hardware. No significant canal stenosis at this 
level. 
Mild canal stenosis at L3-4, and borderline to mild stenosis at L2-3. Slight 
retrolisthesis at L2-3. These findings are not significantly different from the

## 2022-08-24 IMAGING — CT CT CALCIUM SCORING
1 series · 15 of 20 positions shown, 19 images · non-contrast
Comparison: There are no previous exams available for comparison.

________________________________________________________________________________________________ 
CT CALCIUM SCORING, 08/24/2022 [DATE]:
INDICATION: Chest Pain, Unspecified

[Series 2: cascoreseq 3.0 b35s 60% · axial · 0.35mm/px · z∈[-226,-104]mm · 15 of 91 slices shown, 19 images]
[im 5/91  vessel]
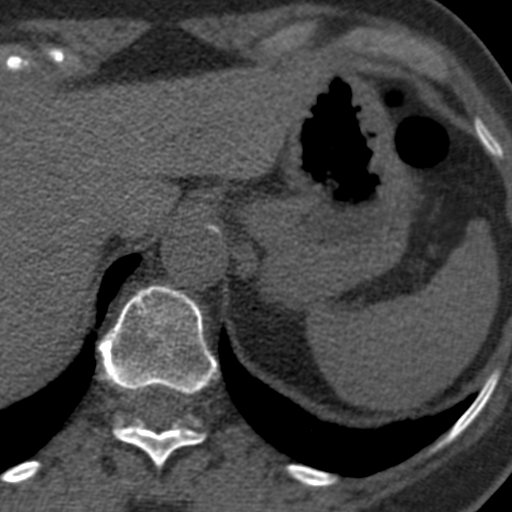
[im 5/91  lung]
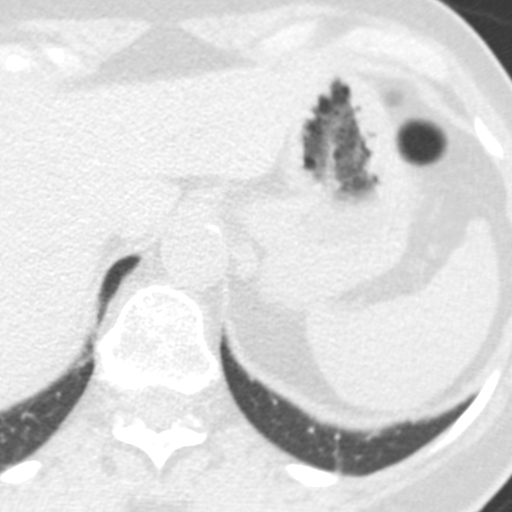
[im 10/91  vessel]
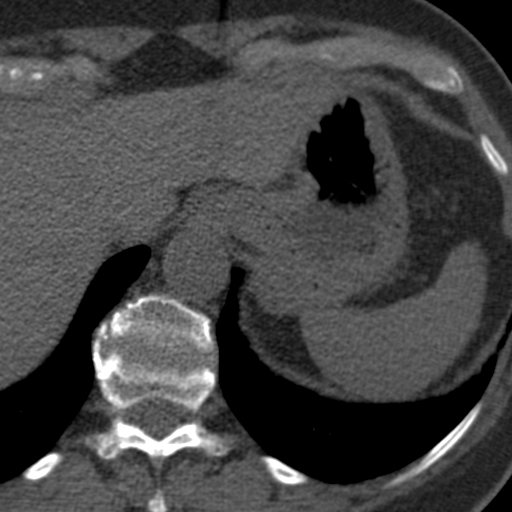
[im 19/91  vessel]
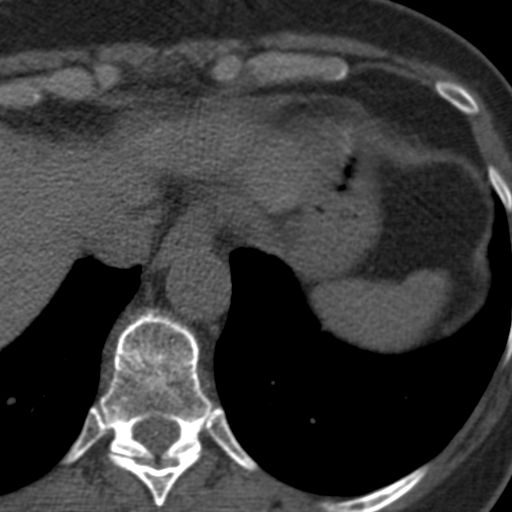
[im 24/91  vessel]
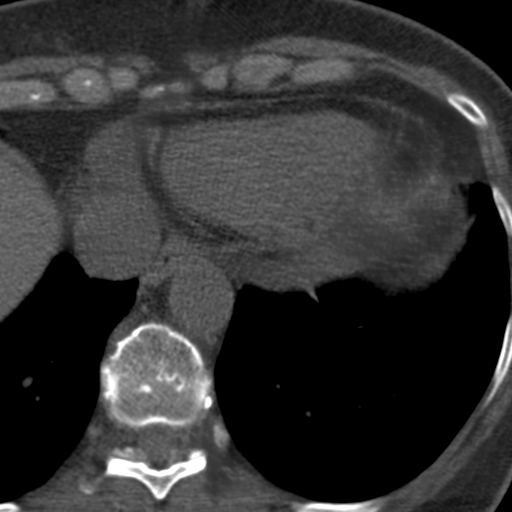
[im 29/91  vessel]
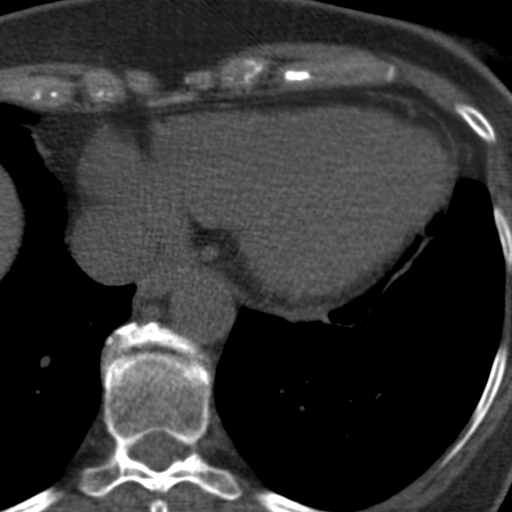
[im 29/91  lung]
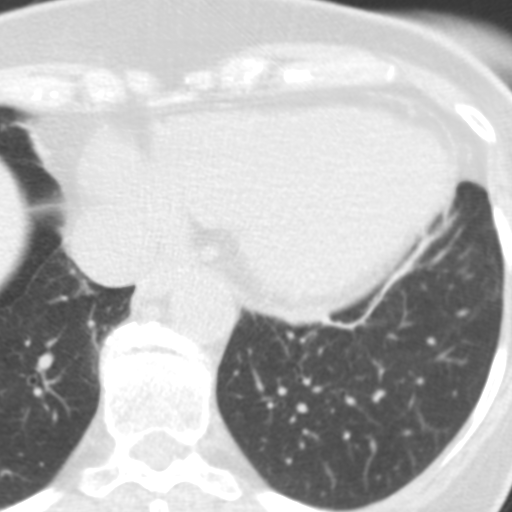
[im 34/91  vessel]
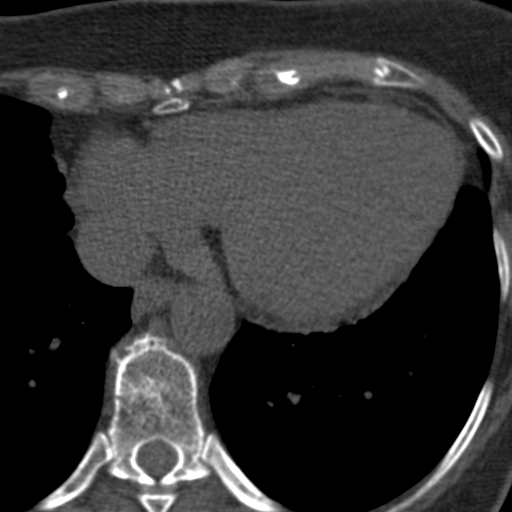
[im 38/91  vessel]
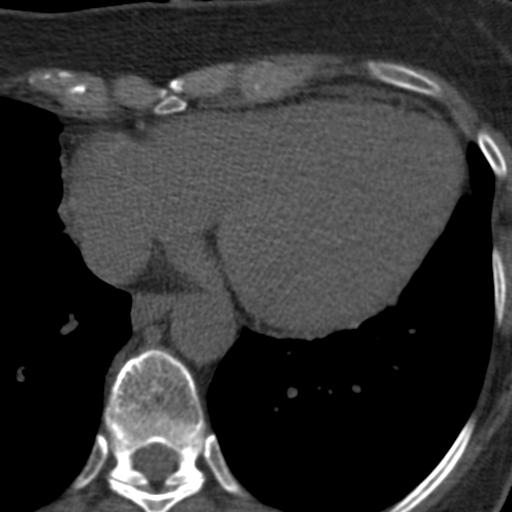
[im 48/91  vessel]
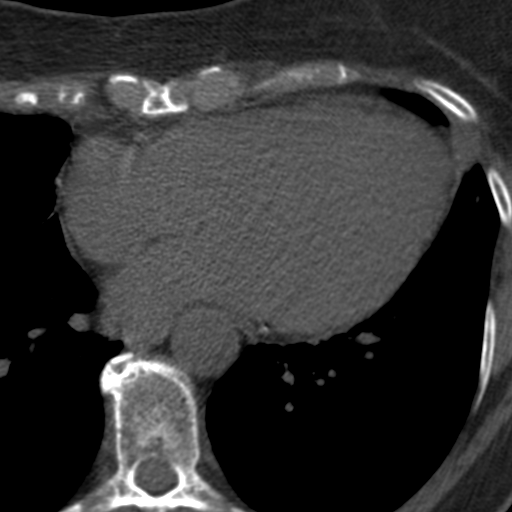
[im 53/91  vessel]
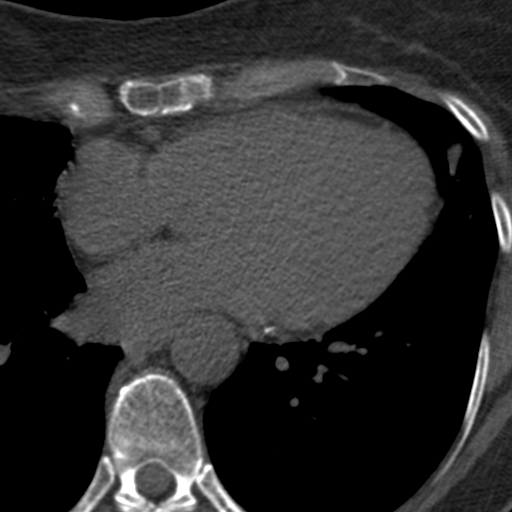
[im 53/91  lung]
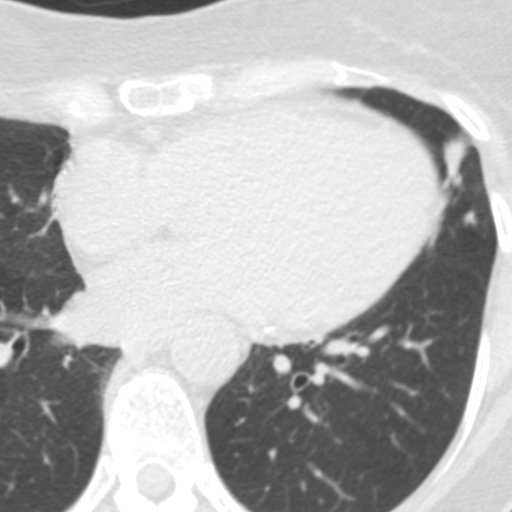
[im 57/91  vessel]
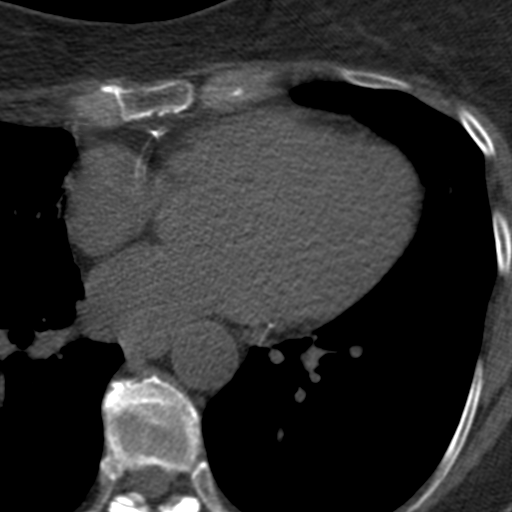
[im 62/91  vessel]
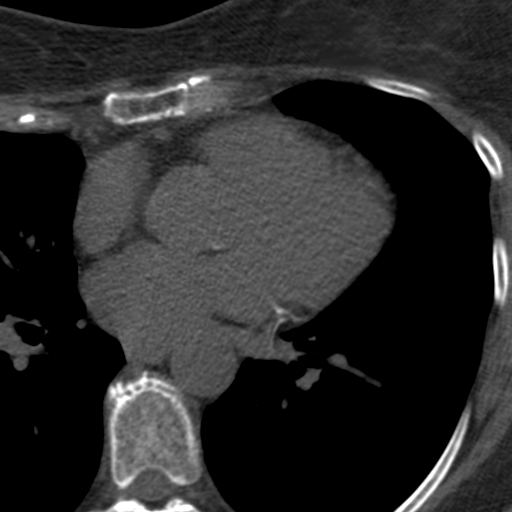
[im 67/91  vessel]
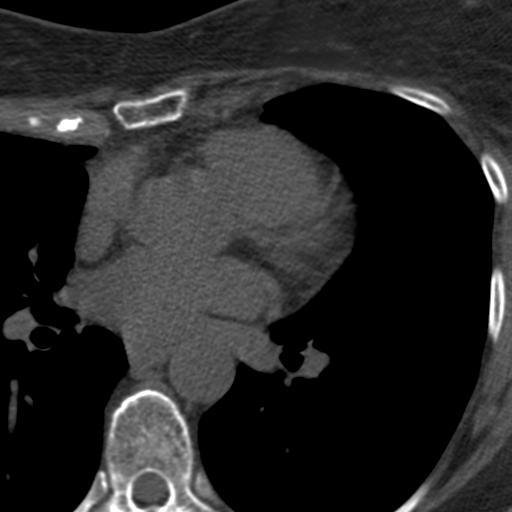
[im 76/91  vessel]
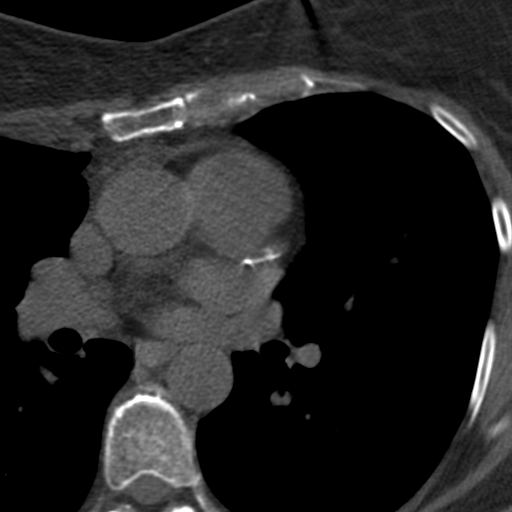
[im 76/91  lung]
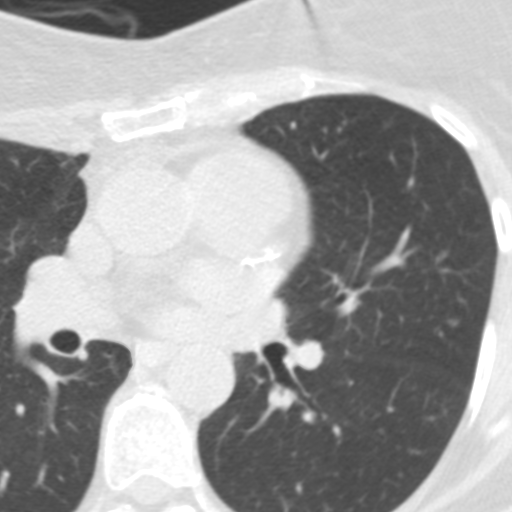
[im 81/91  vessel]
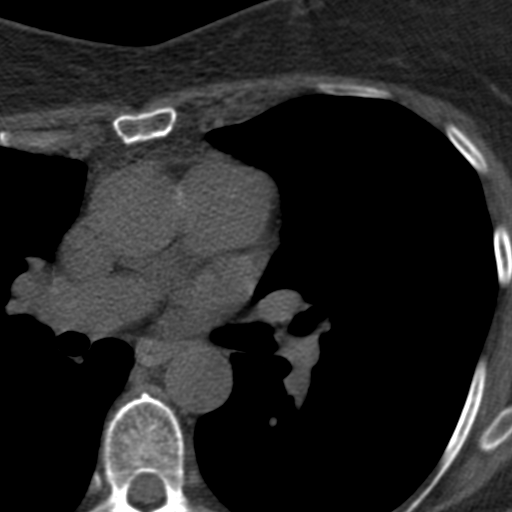
[im 86/91  vessel]
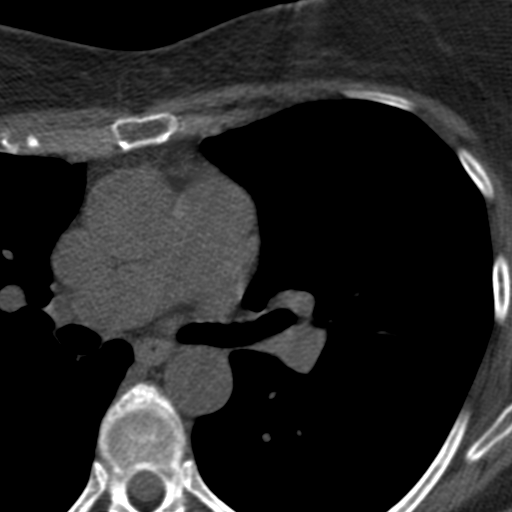

[15 of 20 positions shown; findings below may reference images not displayed]

Count of known CT and Cardiac Nuclear Medicine studies performed in the previous 
12 months = 0
FINDINGS: Visualized lungs are clear.
IMPRESSION: Left Main (LM) Score: 0 
Left Anterior Descending Artery (LAD) Score: 295 
Circumflex (CM) Score: 89 
Right Coronary Artery (RCA) Score: 67 
Posterior Descending Artery (PDA) Score: 0 
Other: 0
IMPRESSION: Total Calcium Score: 451 
Bronchiolitic changes seen within the lingula and right middle lobe incompletely 
evaluated. Dedicated CT the chest is recommended. 
If calcium score is greater than 100, and the patient is symptomatic with chest 
pain or shortness of breath, would recommend follow-up with coronary CTA with 
Cleerly plaque analysis. 
Calcium scoring sheets to follow. 
RADIATION DOSE REDUCTION: All CT scans are performed using radiation dose 
reduction techniques, when applicable.  Technical factors are evaluated and 
adjusted to ensure appropriate moderation of exposure.  Automated dose 
management technology is applied to adjust the radiation doses to minimize 
exposure while achieving diagnostic quality images. 
Consider further evaluation if multi-vessel or left-main predominant disease is 
present. 
Calcium score                                     Presence of Plaque 
0                                                    No evidence of plaque 
1-10                                               Minimal evidence of plaque 
11-100                                            Mild evidence of plaque 
101-400                                          Moderate evidence of plaque 
Over 400                                        Extensive evidence of plaque

## 2022-09-08 IMAGING — CT CTA CORONARY
2 of 13 series · 5 of 20 positions shown, 6 images · IV contrast (APPLIED)
Comparison: None.

________________________________________________________________________________________________ 
CTA CORONARY, 09/08/2022 [DATE]: 
CLINICAL INDICATION:  Chest Pain, Unspecified 
A search for DICOM formatted images was conducted for prior CT imaging studies 
completed at a non-affiliated media free facility.
TECHNIQUE: The region of interest was scanned with contrast on a high resolution 
CT scanner using dose reduction techniques. 3D renderings were reconstructed on 
an independent rotation. 60 mL of Isovue 370 MDV were administered per protocol. 
Images were performed and reconstructed using ECG gating. To reduce the risk of 
nephrotoxicity and to adequately monitor the patient, the patient was monitored 
for over 30 minutes post procedure with 250 mL of normal saline administered 
intravenously. 
Study was initially sent for AI/CLEERLY EVALUATION WHICH COULD NOT BE OBTAINED 
SECONDARY TO MOTION ARTIFACT. 
CORONARY ARTERY EVALUATION: 
Dominance: The patient is right dominant. 
Left Main: No plaque or stenosis. 
Left Anterior Descending: Moderate calcified plaque causing approximately 40% 
stenosis. 
Left Circumflex: Mild atherosclerotic plaque but no significant focal stenosis. 
Right Coronary: Mild/moderate eccentric atherosclerotic plaque causing 
approximately 30% stenosis. 
CARDIAC ANATOMY: No chamber enlargement or hypertrophy.  No evidence of 
hypoperfusion or infarct. 
AORTA/GREAT VESSELS: No aneurysm or dissection. 
LUNGS/ PLEURA: Clear.  No effusion. 
MEDIASTINUM: No mass. 
OSSEOUS STRUCTURES: No acute fracture or destructive lesion.

[Series 9: func corcta 0-90% · axial · 0.35mm/px · z∈[-198,-142]mm · 3 of 2210 slices shown, 4 images]
[im 553/2210  vessel]
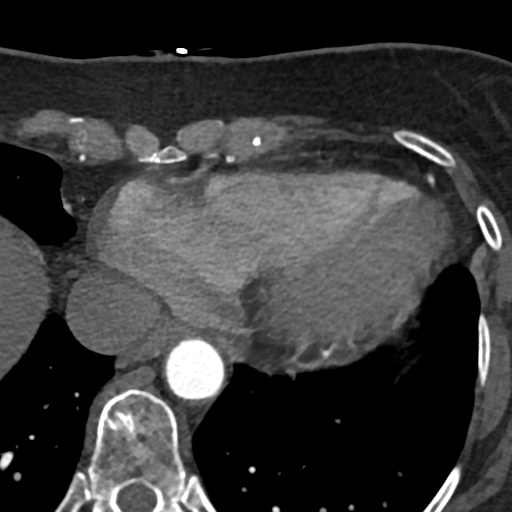
[im 553/2210  lung]
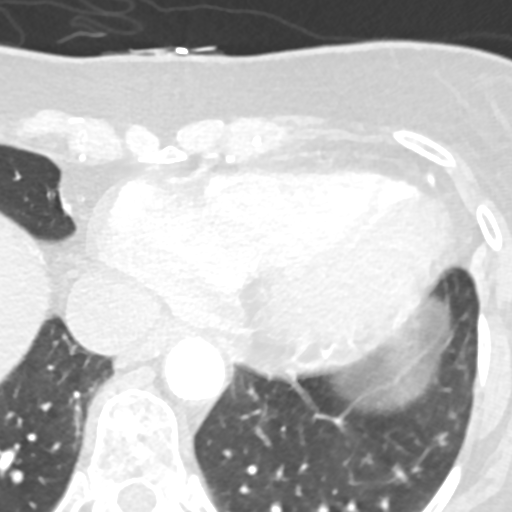
[im 1105/2210  vessel]
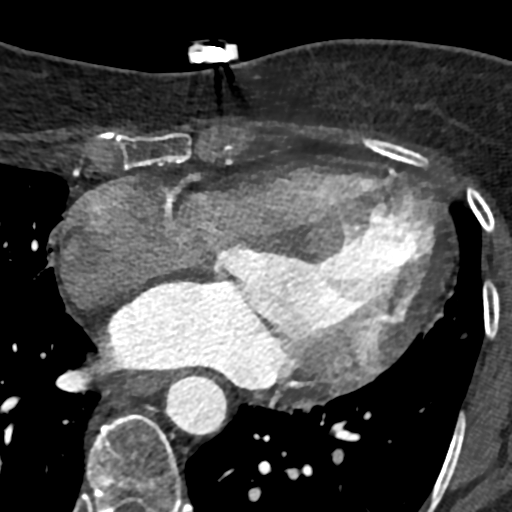
[im 1657/2210  vessel]
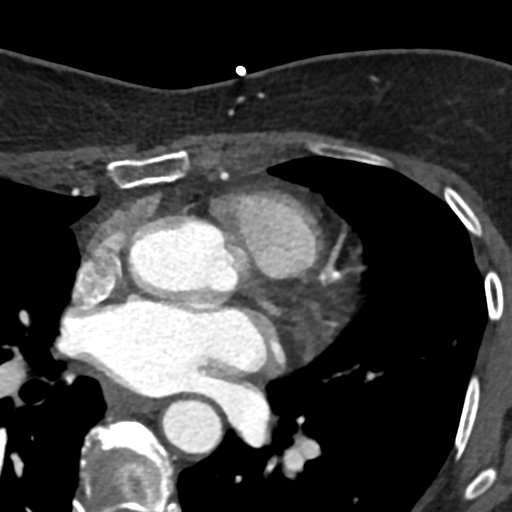

[Series 19: (id)-80% thins · axial · 0.35mm/px · z∈[-188,-152]mm · 2 of 1835 slices shown]
[im 612/1835  vessel]
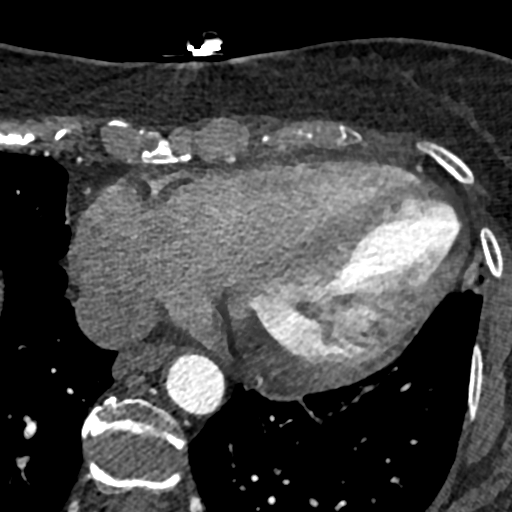
[im 1223/1835  vessel]
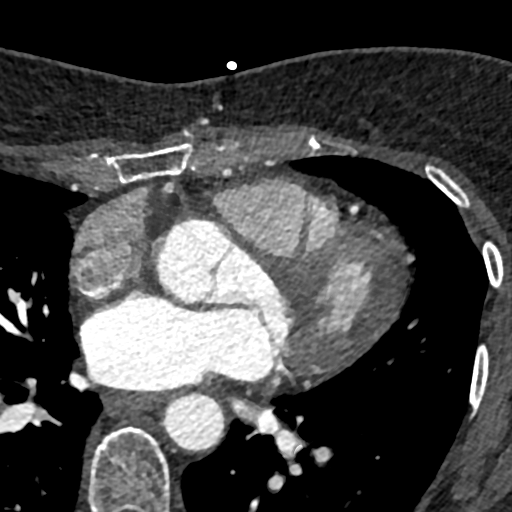

[5 of 20 positions shown; findings below may reference images not displayed]

Count of known CT and Cardiac Nuclear Medicine studies performed in the previous 
12 months = 0,
IMPRESSION: 1. Normal myocardial morphology and function. 
2. Moderate atherosclerotic plaque in the LAD causing approximately 40% stenosis 
and mild/moderate atherosclerotic plaque right coronary causing approximately 
30% stenosis. No significant stenosis within the left main or circumflex. 
3. No significant extra-cardiac findings. 
CAD RADS: 
CAD-RADS 2    25-49%   Mild non-obstructive 
RADIATION DOSE REDUCTION: All CT scans are performed using radiation dose 
reduction techniques, when applicable.  Technical factors are evaluated and 
adjusted to ensure appropriate moderation of exposure.  Automated dose 
management technology is applied to adjust the radiation doses to minimize 
exposure while achieving diagnostic quality images.

## 5506-08-11 DEATH — deceased
